# Patient Record
Sex: Male | Born: 1995
Health system: Southern US, Community
[De-identification: ages and names within clinical notes are randomized; demographics above are authoritative.]

---
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
## 2018-01-22 DIAGNOSIS — Z0000 Encounter for general adult medical examination without abnormal findings: Secondary | ICD-10-CM | POA: Diagnosis not present

## 2018-07-27 DIAGNOSIS — Z79899 Other long term (current) drug therapy: Secondary | ICD-10-CM | POA: Diagnosis not present

## 2018-07-27 DIAGNOSIS — Z1339 Encounter for screening examination for other mental health and behavioral disorders: Secondary | ICD-10-CM | POA: Diagnosis not present

## 2018-07-27 DIAGNOSIS — F900 Attention-deficit hyperactivity disorder, predominantly inattentive type: Secondary | ICD-10-CM | POA: Diagnosis not present

## 2018-07-27 DIAGNOSIS — F341 Dysthymic disorder: Secondary | ICD-10-CM | POA: Diagnosis not present

## 2018-07-27 DIAGNOSIS — F3110 Bipolar disorder, current episode manic without psychotic features, unspecified: Secondary | ICD-10-CM | POA: Diagnosis not present

## 2018-08-29 DIAGNOSIS — Z711 Person with feared health complaint in whom no diagnosis is made: Secondary | ICD-10-CM | POA: Diagnosis not present

## 2018-09-08 DIAGNOSIS — J309 Allergic rhinitis, unspecified: Secondary | ICD-10-CM | POA: Diagnosis not present

## 2018-09-08 MED FILL — LEVOCETIRIZINE 5 MG TABLET: 5 | 30 days supply | Qty: 30 | Fill #0

## 2018-09-08 MED FILL — OLOPATADINE HCL 0.2% EYE DR: 0.2 | 30 days supply | Qty: 3 | Fill #0

## 2018-09-08 MED FILL — ALBUTEROL SULFATE HFA 108 (: 108 (90 BAS | 16 days supply | Qty: 18 | Fill #0

## 2018-09-08 MED FILL — FLUTICASONE PROP 50 MCG SPR: 50 | 60 days supply | Qty: 16 | Fill #0

## 2018-09-21 DIAGNOSIS — F341 Dysthymic disorder: Secondary | ICD-10-CM | POA: Diagnosis not present

## 2018-09-21 DIAGNOSIS — Z79899 Other long term (current) drug therapy: Secondary | ICD-10-CM | POA: Diagnosis not present

## 2018-09-21 DIAGNOSIS — Z131 Encounter for screening for diabetes mellitus: Secondary | ICD-10-CM | POA: Diagnosis not present

## 2018-09-21 DIAGNOSIS — F900 Attention-deficit hyperactivity disorder, predominantly inattentive type: Secondary | ICD-10-CM | POA: Diagnosis not present

## 2018-09-21 DIAGNOSIS — F3110 Bipolar disorder, current episode manic without psychotic features, unspecified: Secondary | ICD-10-CM | POA: Diagnosis not present

## 2018-09-21 DIAGNOSIS — E7800 Pure hypercholesterolemia, unspecified: Secondary | ICD-10-CM | POA: Diagnosis not present

## 2018-09-21 DIAGNOSIS — R5383 Other fatigue: Secondary | ICD-10-CM | POA: Diagnosis not present

## 2018-09-21 DIAGNOSIS — Z1339 Encounter for screening examination for other mental health and behavioral disorders: Secondary | ICD-10-CM | POA: Diagnosis not present

## 2018-09-22 MED FILL — OLOPATADINE HCL 0.2% EYE DR: 0.2 | 30 days supply | Qty: 3 | Fill #1

## 2018-09-23 MED FILL — FLUOXETINE HCL 10 MG TABS: 10 | 30 days supply | Qty: 30 | Fill #0

## 2018-09-23 MED FILL — OLANZapine 2.5 MG TABS: 2.5 | 30 days supply | Qty: 30 | Fill #0

## 2018-10-22 DIAGNOSIS — F3110 Bipolar disorder, current episode manic without psychotic features, unspecified: Secondary | ICD-10-CM | POA: Diagnosis not present

## 2018-10-22 DIAGNOSIS — Z79899 Other long term (current) drug therapy: Secondary | ICD-10-CM | POA: Diagnosis not present

## 2018-10-22 DIAGNOSIS — F341 Dysthymic disorder: Secondary | ICD-10-CM | POA: Diagnosis not present

## 2018-10-22 DIAGNOSIS — F900 Attention-deficit hyperactivity disorder, predominantly inattentive type: Secondary | ICD-10-CM | POA: Diagnosis not present

## 2019-01-06 MED FILL — FLUOXETINE HCL 10 MG TABS: 10 | 30 days supply | Qty: 30 | Fill #0

## 2019-01-06 MED FILL — OLANZapine 2.5 MG TABS: 2.5 | 30 days supply | Qty: 30 | Fill #0

## 2019-09-01 DIAGNOSIS — J3081 Allergic rhinitis due to animal (cat) (dog) hair and dander: Secondary | ICD-10-CM | POA: Diagnosis not present

## 2019-09-01 DIAGNOSIS — J301 Allergic rhinitis due to pollen: Secondary | ICD-10-CM | POA: Diagnosis not present

## 2019-09-01 DIAGNOSIS — J3089 Other allergic rhinitis: Secondary | ICD-10-CM | POA: Diagnosis not present

## 2019-09-01 DIAGNOSIS — H1045 Other chronic allergic conjunctivitis: Secondary | ICD-10-CM | POA: Diagnosis not present

## 2019-09-14 MED FILL — LEVOCETIRIZINE 5 MG TABLET: 5 | 30 days supply | Qty: 30 | Fill #0

## 2019-11-18 DIAGNOSIS — J301 Allergic rhinitis due to pollen: Secondary | ICD-10-CM | POA: Diagnosis not present

## 2019-11-18 DIAGNOSIS — J3081 Allergic rhinitis due to animal (cat) (dog) hair and dander: Secondary | ICD-10-CM | POA: Diagnosis not present

## 2020-04-17 DIAGNOSIS — D485 Neoplasm of uncertain behavior of skin: Secondary | ICD-10-CM | POA: Diagnosis not present

## 2020-04-28 DIAGNOSIS — F988 Other specified behavioral and emotional disorders with onset usually occurring in childhood and adolescence: Secondary | ICD-10-CM | POA: Diagnosis not present

## 2020-04-28 DIAGNOSIS — Z113 Encounter for screening for infections with a predominantly sexual mode of transmission: Secondary | ICD-10-CM | POA: Diagnosis not present

## 2020-04-28 DIAGNOSIS — L729 Follicular cyst of the skin and subcutaneous tissue, unspecified: Secondary | ICD-10-CM | POA: Diagnosis not present

## 2020-04-28 DIAGNOSIS — F3160 Bipolar disorder, current episode mixed, unspecified: Secondary | ICD-10-CM | POA: Diagnosis not present

## 2020-07-20 DIAGNOSIS — M25562 Pain in left knee: Secondary | ICD-10-CM | POA: Diagnosis not present

## 2020-07-20 DIAGNOSIS — S83422A Sprain of lateral collateral ligament of left knee, initial encounter: Secondary | ICD-10-CM | POA: Diagnosis not present

## 2020-07-27 DIAGNOSIS — S83512A Sprain of anterior cruciate ligament of left knee, initial encounter: Secondary | ICD-10-CM | POA: Diagnosis not present

## 2020-08-18 DIAGNOSIS — S83512A Sprain of anterior cruciate ligament of left knee, initial encounter: Secondary | ICD-10-CM | POA: Diagnosis not present

## 2020-09-11 ENCOUNTER — Other Ambulatory Visit (HOSPITAL_COMMUNITY): Payer: Self-pay

## 2020-09-11 DIAGNOSIS — S83522A Sprain of posterior cruciate ligament of left knee, initial encounter: Secondary | ICD-10-CM | POA: Diagnosis not present

## 2020-09-11 DIAGNOSIS — G8918 Other acute postprocedural pain: Secondary | ICD-10-CM | POA: Diagnosis not present

## 2020-09-11 DIAGNOSIS — M65862 Other synovitis and tenosynovitis, left lower leg: Secondary | ICD-10-CM | POA: Diagnosis not present

## 2020-09-11 DIAGNOSIS — M25862 Other specified joint disorders, left knee: Secondary | ICD-10-CM | POA: Diagnosis not present

## 2020-09-11 DIAGNOSIS — S83272A Complex tear of lateral meniscus, current injury, left knee, initial encounter: Secondary | ICD-10-CM | POA: Diagnosis not present

## 2020-09-11 DIAGNOSIS — S83512A Sprain of anterior cruciate ligament of left knee, initial encounter: Secondary | ICD-10-CM | POA: Diagnosis not present

## 2020-09-11 DIAGNOSIS — M659 Synovitis and tenosynovitis, unspecified: Secondary | ICD-10-CM | POA: Diagnosis not present

## 2020-09-11 DIAGNOSIS — M24662 Ankylosis, left knee: Secondary | ICD-10-CM | POA: Diagnosis not present

## 2020-09-11 MED ORDER — ONDANSETRON 4 MG PO TBDP
ORAL_TABLET | ORAL | 0 refills | Status: DC
  Filled 2020-09-11: qty 20, 7d supply, fill #0

## 2020-09-11 MED ORDER — METHOCARBAMOL 500 MG PO TABS
ORAL_TABLET | ORAL | 0 refills | Status: DC
  Filled 2020-09-11: qty 45, 11d supply, fill #0

## 2020-09-15 ENCOUNTER — Other Ambulatory Visit (HOSPITAL_COMMUNITY): Payer: Self-pay

## 2020-09-22 ENCOUNTER — Other Ambulatory Visit (HOSPITAL_COMMUNITY): Payer: Self-pay

## 2020-12-28 DIAGNOSIS — Z03818 Encounter for observation for suspected exposure to other biological agents ruled out: Secondary | ICD-10-CM | POA: Diagnosis not present

## 2021-08-17 ENCOUNTER — Other Ambulatory Visit: Payer: Self-pay

## 2021-08-17 ENCOUNTER — Ambulatory Visit
Admission: EM | Admit: 2021-08-17 | Discharge: 2021-08-17 | Disposition: A | Discharge status: Home / Self Care | Payer: 59 | Attending: Emergency Medicine | Admitting: Emergency Medicine

## 2021-08-17 DIAGNOSIS — N5089 Other specified disorders of the male genital organs: Secondary | ICD-10-CM | POA: Insufficient documentation

## 2021-08-17 DIAGNOSIS — R1031 Right lower quadrant pain: Secondary | ICD-10-CM | POA: Insufficient documentation

## 2021-08-17 NOTE — Discharge Instructions (Addendum)
The results of your STD testing today will be made available to you once they are complete, this typically takes 3 to 5 days.  They will initially be posted to your MyChart and, if any of your results are abnormal, you will receive a phone call with those results along with further instructions regarding any further treatment, if needed.  ? ?If your syphilis test is positive, you will be asked to come back to the clinic for a Bicillin injection for definitive treatment. ? ?If your herpes test is positive, you will be provided with a prescription for Val acyclovir, 1 g daily for the next 90 days.  I have initiated a request to help you find a primary care provider so that this provider will be able to continue to prescribe valacyclovir for you.  Genital herpes responds well to suppressive treatment with valacyclovir if it is taken regularly. ?  ?Please remember that the only way to prevent transmission of sexually transmitted disease when having sexual intercourse is to use condoms.  Repeat sexually transmitted infections can cause scarring of the tubes that carry sperm from your testicles to your penis during ejaculation.  This can interfere with your your ability to have children.  Repeat exposures to sexually transmitted diseases can also increase your risk of human papilloma virus which causes genital warts. ?  ?If you have not had complete resolution of your symptoms after completing treatment, please return for repeat evaluation. ?  ?Thank you for visiting urgent care today.  I appreciate the opportunity to participate in your care. ? ?

## 2021-08-17 NOTE — ED Provider Notes (Addendum)
[facility]    CSN: [id number] Arrival date & time: 08/17/21  1134    HISTORY   Chief Complaint  Patient presents with   SEXUALLY TRANSMITTED DISEASE   HPI Richard Hill is a 26 y.o. male. Pt reports thinking he had an abrasion/laceration to shaft of penis on 02/14.  Pt put peroxide on area. Was initially improving and almost healed then worsened after sexually active with partner.  Wants to make sure it is not an STD.  Is sexually active in monogamous relationship.  No penile drainage, rash, dysuria.  Just this worsening sore.  Patient also complains of a lump in his right groin.  Denies history of hernia.   History reviewed. No pertinent past medical history. There are no problems to display for this patient.  History reviewed. No pertinent surgical history.  Home Medications    Prior to Admission medications   Medication Sig Start Date End Date Taking? Authorizing Provider   Family History Family History  Problem Relation Age of Onset   Healthy Mother    Healthy Father    Social History Social History   Tobacco Use   Smoking status: Never   Smokeless tobacco: Never  Vaping Use   Vaping Use: Never used  Substance Use Topics   Alcohol use: Yes    Comment: socially   Drug use: Never   Allergies   Patient has no known allergies.  Review of Systems Review of Systems Pertinent findings noted in history of present illness.   Physical Exam Triage Vital Signs ED Triage Vitals  Enc Vitals Group     BP 04/06/21 0827 (!) 147/82     Pulse Rate 04/06/21 0827 72     Resp 04/06/21 0827 18     Temp 04/06/21 0827 98.3 F (36.8 C)     Temp Source 04/06/21 0827 Oral     SpO2 04/06/21 0827 98 %     Weight --      Height --      Head Circumference --      Peak Flow --      Pain Score 04/06/21 0826 5     Pain Loc --      Pain Edu? --      Excl. in [place]? --   No data found.  Updated Vital Signs BP (!) 149/106 (BP Location: Right Arm)     Pulse 99    Temp 98.8 F (37.1 C) (Oral)    Resp 20    SpO2 98%   Physical Exam Vitals and nursing note reviewed.  Constitutional:      General: He is not in acute distress.    Appearance: Normal appearance. He is not ill-appearing.  HENT:     Head: Normocephalic and atraumatic.  Eyes:     General: Lids are normal.        Right eye: No discharge.        Left eye: No discharge.     Extraocular Movements: Extraocular movements intact.     Conjunctiva/sclera: Conjunctivae normal.     Right eye: Right conjunctiva is not injected.     Left eye: Left conjunctiva is not injected.  Neck:     Trachea: Trachea and phonation normal.  Cardiovascular:     Rate and Rhythm: Normal rate and regular rhythm.     Pulses: Normal pulses.     Heart sounds: Normal heart sounds. No murmur heard.   No friction rub. No gallop.  Pulmonary:  Effort: Pulmonary effort is normal. No accessory muscle usage, prolonged expiration or respiratory distress.     Breath sounds: Normal breath sounds. No stridor, decreased air movement or transmitted upper airway sounds. No decreased breath sounds, wheezing, rhonchi or rales.  Chest:     Chest wall: No tenderness.  Genitourinary:    Comments: 1 cm lesion at distal end of penile shaft beneath glans penis that is deep, ragged with a purulent yellow-gray base and undetermined, violaceous border Musculoskeletal:        General: Normal range of motion.     Cervical back: Normal range of motion and neck supple. Normal range of motion.  Lymphadenopathy:     Cervical: No cervical adenopathy.     Lower Body: Right inguinal adenopathy (Tender to palpation) present.  Skin:    General: Skin is warm and dry.     Findings: No erythema or rash.  Neurological:     General: No focal deficit present.     Mental Status: He is alert and oriented to person, place, and time.  Psychiatric:        Mood and Affect: Mood normal.        Behavior: Behavior normal.    Visual  Acuity Right Eye Distance:   Left Eye Distance:   Bilateral Distance:    Right Eye Near:   Left Eye Near:    Bilateral Near:     [facility] Couse / Diagnostics / Procedures:    EKG  Radiology No results found.  Procedures Procedures (including critical care time)  UC Diagnoses / Final Clinical Impressions(s)   I have reviewed the triage vital signs and the nursing notes.  Pertinent labs & imaging results that were available during my care of the patient were reviewed by me and considered in my medical decision making (see chart for details).    Final diagnoses:  Pain in the groin, right  Genital lesion, male   STD screening was performed, patient advised that the results be posted to their MyChart and if any of the results are positive, they will be notified by phone, further treatment will be provided as indicated based on results of STD screening.  Patient advised that if his syphilis test is positive, he will be asked to come back to the clinic for a Bicillin injection.  Patient advised that if his herpes culture is positive, he will be provided with a prescription for valacyclovir 1 g to take daily for the next 90 days until he can follow-up with a primary care provider who can continue this prescription for him.  Patient was advised to abstain from sexual intercourse for the next while awaiting test results and 7 more days if treatment is required.    Patient was also advised to use condoms to protect themselves from STD exposure.  Return precautions advised.  Drug allergies reviewed, all questions addressed.     ED Prescriptions   None    PDMP not reviewed this encounter.  Pending results:  Labs Reviewed  HSV CULTURE AND TYPING  RPR  HIV ANTIBODY (ROUTINE TESTING W REFLEX)  CYTOLOGY, (ORAL, ANAL, URETHRAL) ANCILLARY ONLY    Medications Ordered in UC: Medications - No data to display  Disposition Upon Discharge:  Condition: stable for discharge home  Patient  presented with concern for an acute illness with associated systemic symptoms and significant discomfort requiring urgent management. In my opinion, this is a condition that a prudent lay person (someone who possesses an average knowledge of  health and medicine) may potentially expect to result in complications if not addressed urgently such as respiratory distress, impairment of bodily function or dysfunction of bodily organs.   As such, the patient has been evaluated and assessed, work-up was performed and treatment was provided in alignment with urgent care protocols and evidence based medicine.  Patient/parent/caregiver has been advised that the patient may require follow up for further testing and/or treatment if the symptoms continue in spite of treatment, as clinically indicated and appropriate.  Routine symptom specific, illness specific and/or disease specific instructions were discussed with the patient and/or caregiver at length.  Prevention strategies for avoiding STD exposure were also discussed.  The patient will follow up with their current PCP if and as advised. If the patient does not currently have a PCP we will assist them in obtaining one.   The patient may need specialty follow up if the symptoms continue, in spite of conservative treatment and management, for further workup, evaluation, consultation and treatment as clinically indicated and appropriate.  Patient/parent/caregiver verbalized understanding and agreement of plan as discussed.  All questions were addressed during visit.  Please see discharge instructions below for further details of plan.  Discharge Instructions:   Discharge Instructions      The results of your STD testing today will be made available to you once they are complete, this typically takes 3 to 5 days.  They will initially be posted to your MyChart and, if any of your results are abnormal, you will receive a phone call with those results along with  further instructions regarding any further treatment, if needed.   If your syphilis test is positive, you will be asked to come back to the clinic for a Bicillin injection for definitive treatment.  If your herpes test is positive, you will be provided with a prescription for Val acyclovir, 1 g daily for the next 90 days.  I have initiated a request to help you find a primary care provider so that this provider will be able to continue to prescribe valacyclovir for you.  Genital herpes responds well to suppressive treatment with valacyclovir if it is taken regularly.   Please remember that the only way to prevent transmission of sexually transmitted disease when having sexual intercourse is to use condoms.  Repeat sexually transmitted infections can cause scarring of the tubes that carry sperm from your testicles to your penis during ejaculation.  This can interfere with your your ability to have children.  Repeat exposures to sexually transmitted diseases can also increase your risk of human papilloma virus which causes genital warts.   If you have not had complete resolution of your symptoms after completing treatment, please return for repeat evaluation.   Thank you for visiting urgent care today.  I appreciate the opportunity to participate in your care.       This office note has been dictated using Museum/gallery curator.  Unfortunately, and despite my best efforts, this method of dictation can sometimes lead to occasional typographical or grammatical errors.  I apologize in advance if this occurs.      Lynden Oxford Scales, PA-C 08/17/21 1253    Lynden Oxford Scales, PA-C 08/17/21 1257

## 2021-08-17 NOTE — ED Triage Notes (Signed)
Pt reports thinking he had an abrasion/laceration to shaft of penis on 02/14.  Pt put peroxide on area. Was initially improving and almost healed then worsened after sexually active with partner.  Wants to make sure it is not an STD.  Is sexually active in monogamous relationship.  No penile drainage, rash, dysuria.  Just this worsening sore.   ? ?Pt also reports lump in R groin - possibly hernia.  ?

## 2021-08-19 LAB — HSV CULTURE AND TYPING

## 2021-08-20 ENCOUNTER — Other Ambulatory Visit (HOSPITAL_COMMUNITY): Payer: Self-pay

## 2021-08-20 ENCOUNTER — Telehealth: Payer: Self-pay | Admitting: Emergency Medicine

## 2021-08-20 DIAGNOSIS — B009 Herpesviral infection, unspecified: Secondary | ICD-10-CM | POA: Insufficient documentation

## 2021-08-20 LAB — CYTOLOGY, (ORAL, ANAL, URETHRAL) ANCILLARY ONLY
Chlamydia: NEGATIVE
Comment: NEGATIVE
Comment: NEGATIVE
Comment: NORMAL
Neisseria Gonorrhea: NEGATIVE
Trichomonas: NEGATIVE

## 2021-08-20 MED ORDER — VALACYCLOVIR HCL 1 G PO TABS
1000.0000 mg | ORAL_TABLET | Freq: Every day | ORAL | 3 refills | Status: AC
  Filled 2021-08-20: qty 90, 90d supply, fill #0
  Filled 2021-12-23 – 2022-01-28 (×2): qty 90, 90d supply, fill #1

## 2021-08-20 NOTE — Telephone Encounter (Signed)
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  Patient provided with a daily suppressive dose of valacyclovir 1 g.  Given duration of symptoms prior to viral testing, patient would not benefit from acute therapy.  Patient provided with 360 days of medication.  Patient advised to follow-up with primary care for further refills. ?

## 2021-08-21 LAB — HIV ANTIBODY (ROUTINE TESTING W REFLEX): HIV Screen 4th Generation wRfx: NONREACTIVE

## 2021-08-21 LAB — RPR: RPR Ser Ql: NONREACTIVE

## 2021-08-21 LAB — SPECIMEN STATUS REPORT

## 2021-09-03 ENCOUNTER — Other Ambulatory Visit (HOSPITAL_COMMUNITY): Payer: Self-pay

## 2021-09-03 DIAGNOSIS — R590 Localized enlarged lymph nodes: Secondary | ICD-10-CM | POA: Diagnosis not present

## 2021-09-03 DIAGNOSIS — B009 Herpesviral infection, unspecified: Secondary | ICD-10-CM | POA: Diagnosis not present

## 2021-09-03 DIAGNOSIS — L239 Allergic contact dermatitis, unspecified cause: Secondary | ICD-10-CM | POA: Diagnosis not present

## 2021-09-03 DIAGNOSIS — J309 Allergic rhinitis, unspecified: Secondary | ICD-10-CM | POA: Diagnosis not present

## 2021-09-03 MED ORDER — PREDNISONE 10 MG PO TABS
ORAL_TABLET | ORAL | 0 refills | Status: AC
  Filled 2021-09-03: qty 21, 6d supply, fill #0

## 2021-09-11 ENCOUNTER — Other Ambulatory Visit (HOSPITAL_COMMUNITY): Payer: Self-pay

## 2021-09-25 DIAGNOSIS — R590 Localized enlarged lymph nodes: Secondary | ICD-10-CM | POA: Diagnosis not present

## 2021-10-08 ENCOUNTER — Other Ambulatory Visit: Payer: Self-pay | Admitting: Family Medicine

## 2021-10-08 ENCOUNTER — Ambulatory Visit
Admission: RE | Admit: 2021-10-08 | Discharge: 2021-10-08 | Disposition: A | Discharge status: Home / Self Care | Payer: 59 | Source: Ambulatory Visit | Attending: Family Medicine | Admitting: Family Medicine

## 2021-10-08 DIAGNOSIS — R1900 Intra-abdominal and pelvic swelling, mass and lump, unspecified site: Secondary | ICD-10-CM | POA: Diagnosis not present

## 2021-10-10 ENCOUNTER — Other Ambulatory Visit (HOSPITAL_BASED_OUTPATIENT_CLINIC_OR_DEPARTMENT_OTHER): Payer: Self-pay | Admitting: Family Medicine

## 2021-10-10 ENCOUNTER — Encounter (HOSPITAL_BASED_OUTPATIENT_CLINIC_OR_DEPARTMENT_OTHER): Payer: Self-pay | Admitting: Radiology

## 2021-10-10 DIAGNOSIS — R1900 Intra-abdominal and pelvic swelling, mass and lump, unspecified site: Secondary | ICD-10-CM

## 2021-10-11 ENCOUNTER — Encounter (HOSPITAL_BASED_OUTPATIENT_CLINIC_OR_DEPARTMENT_OTHER): Payer: Self-pay

## 2021-10-11 ENCOUNTER — Ambulatory Visit (HOSPITAL_BASED_OUTPATIENT_CLINIC_OR_DEPARTMENT_OTHER)
Admission: RE | Admit: 2021-10-11 | Discharge: 2021-10-11 | Disposition: A | Discharge status: Home / Self Care | Payer: 59 | Source: Ambulatory Visit | Attending: Family Medicine | Admitting: Family Medicine

## 2021-10-11 ENCOUNTER — Other Ambulatory Visit: Payer: Self-pay | Admitting: Family Medicine

## 2021-10-11 ENCOUNTER — Ambulatory Visit (HOSPITAL_BASED_OUTPATIENT_CLINIC_OR_DEPARTMENT_OTHER): Admission: RE | Admit: 2021-10-11 | Payer: 59 | Source: Ambulatory Visit

## 2021-10-11 ENCOUNTER — Other Ambulatory Visit (HOSPITAL_COMMUNITY): Payer: Self-pay | Admitting: Family Medicine

## 2021-10-11 DIAGNOSIS — R590 Localized enlarged lymph nodes: Secondary | ICD-10-CM

## 2021-10-11 DIAGNOSIS — R1900 Intra-abdominal and pelvic swelling, mass and lump, unspecified site: Secondary | ICD-10-CM | POA: Insufficient documentation

## 2021-10-11 MED ORDER — IOHEXOL 300 MG/ML  SOLN
100.0000 mL | Freq: Once | INTRAMUSCULAR | Status: AC | PRN
  Administered 2021-10-11: 80 mL via INTRAVENOUS

## 2021-10-12 ENCOUNTER — Encounter: Payer: Self-pay | Admitting: *Deleted

## 2021-10-12 NOTE — Progress Notes (Unsigned)
Aletta Edouard, MD  [facility] Maser ?OK for US guided core biopsy of left inguinal lymph node.  ? ?GY ?

## 2021-10-15 ENCOUNTER — Other Ambulatory Visit: Payer: Self-pay | Admitting: Student

## 2021-10-16 ENCOUNTER — Encounter (HOSPITAL_COMMUNITY): Payer: Self-pay

## 2021-10-16 ENCOUNTER — Ambulatory Visit (HOSPITAL_COMMUNITY)
Admission: RE | Admit: 2021-10-16 | Discharge: 2021-10-16 | Disposition: A | Discharge status: Home / Self Care | Payer: 59 | Source: Ambulatory Visit | Attending: Family Medicine | Admitting: Family Medicine

## 2021-10-16 ENCOUNTER — Other Ambulatory Visit: Payer: Self-pay

## 2021-10-16 DIAGNOSIS — R590 Localized enlarged lymph nodes: Secondary | ICD-10-CM | POA: Insufficient documentation

## 2021-10-16 DIAGNOSIS — R1900 Intra-abdominal and pelvic swelling, mass and lump, unspecified site: Secondary | ICD-10-CM | POA: Diagnosis not present

## 2021-10-16 DIAGNOSIS — R599 Enlarged lymph nodes, unspecified: Secondary | ICD-10-CM | POA: Diagnosis not present

## 2021-10-16 MED ORDER — FENTANYL CITRATE (PF) 100 MCG/2ML IJ SOLN
INTRAMUSCULAR | Status: AC | PRN
  Administered 2021-10-16: 50 ug via INTRAVENOUS

## 2021-10-16 MED ORDER — LIDOCAINE HCL (PF) 1 % IJ SOLN
INTRAMUSCULAR | Status: AC
  Filled 2021-10-16: qty 30

## 2021-10-16 MED ORDER — FENTANYL CITRATE (PF) 100 MCG/2ML IJ SOLN
INTRAMUSCULAR | Status: AC
  Filled 2021-10-16: qty 2

## 2021-10-16 MED ORDER — MIDAZOLAM HCL 2 MG/2ML IJ SOLN
INTRAMUSCULAR | Status: AC
  Filled 2021-10-16: qty 2

## 2021-10-16 MED ORDER — MIDAZOLAM HCL 2 MG/2ML IJ SOLN
INTRAMUSCULAR | Status: AC | PRN
  Administered 2021-10-16: 1 mg via INTRAVENOUS

## 2021-10-16 MED ORDER — SODIUM CHLORIDE 0.9 % IV SOLN: INTRAVENOUS | Status: DC

## 2021-10-16 NOTE — Procedures (Signed)
Interventional Radiology Procedure Note ? ?Procedure: US guided biopsy of left inguinal lymph node ?Complications: None ?EBL: None ?Specimen: 4 x 16g core ? ?Recommendations: ?- Bedrest 1 hours.   ?- Routine wound care ?- Follow up pathology ?- Advance diet  ?- ice as needed ? ?Signed, ? ?Corrie Mckusick, DO ? ? ?

## 2021-10-16 NOTE — H&P (Signed)
? ? ? ?Chief Complaint: ?Lymphadenopathy ? ?Referring Physician(s): ?Sela Hilding ? ?Supervising Physician: Corrie Mckusick ? ?Patient Status: [facility] - Out-pt ? ?History of Present Illness: ?Richard Hill is a 26 y.o. healthy male who was seen in the ED recently for a  genital lesion. ? ?At that time he also c/o a lump in his groin. ? ?US done 5/2 showed= ?Solid masses in the palpable area left groin, largest ?measures 4.8 x 2.1 x 2 5 cm. This is nonspecific, question abnormal ?enlarged lymph nodes. There are enlarged lymph nodes in the ?contralateral right groin, largest measures 1.2 cm. Lymphoma is not ?excluded. Recommend further evaluation with CT of the abdomen and ?pelvis. ? ?CT done 5/4 showed= ?Enlarged left external iliac and left inguinal lymph nodes, greater ?than expected in size for reactive adenopathy and concerning for ?lymphoproliferative disease. Recommend tissue sampling. ? ?He is here today for biopsy. ? ?He is NPO. No nausea/vomiting. No Fever/chills. ROS negative. ? ? ?History reviewed. No pertinent past medical history. ? ?History reviewed. No pertinent surgical history. ? ?Allergies: ?Patient has no known allergies. ? ?Medications: ?Prior to Admission medications   ?Medication Sig Start Date End Date Taking? Authorizing Provider  ?predniSONE (DELTASONE) 10 MG tablet Taper dose. Start with 6 tabs on day 1 then decrease by 1 tab daily (6-5-4-3-2-1). 09/03/21  Yes   ?valACYclovir (VALTREX) 1000 MG tablet Take 1 tablet (1,000 mg total) by mouth daily. 08/20/21 08/15/22 Yes Lynden Oxford Scales, PA-C  ?  ? ?Family History  ?Problem Relation Age of Onset  ? Healthy Mother   ? Healthy Father   ? ? ?Social History  ? ?Socioeconomic History  ? Marital status: Single  ?  Spouse name: Not on file  ? Number of children: Not on file  ? Years of education: Not on file  ? Highest education level: Not on file  ?Occupational History  ? Not on file  ?Tobacco Use  ? Smoking status: Never  ? Smokeless  tobacco: Never  ?Vaping Use  ? Vaping Use: Never used  ?Substance and Sexual Activity  ? Alcohol use: Yes  ?  Comment: socially  ? Drug use: Never  ? Sexual activity: Yes  ?  Birth control/protection: None  ?Other Topics Concern  ? Not on file  ?Social History Narrative  ? Not on file  ? ?Social Determinants of Health  ? ?Financial Resource Strain: Not on file  ?Food Insecurity: Not on file  ?Transportation Needs: Not on file  ?Physical Activity: Not on file  ?Stress: Not on file  ?Social Connections: Not on file  ? ? ? ?Review of Systems: A 12 point ROS discussed and pertinent positives are indicated in the HPI above.  All other systems are negative. ? ?Review of Systems ? ?Vital Signs: ?BP (!) 154/87 (BP Location: Right Arm)   Pulse (!) 59   Temp 98 ?F (36.7 ?C) (Oral)   Ht '5\' 7"'$  (1.702 m)   Wt 180 lb (81.6 kg)   BMI 28.19 kg/m?  ? ?Physical Exam ?Vitals reviewed.  ?Constitutional:   ?   Appearance: Normal appearance.  ?HENT:  ?   Head: Normocephalic and atraumatic.  ?Eyes:  ?   Extraocular Movements: Extraocular movements intact.  ?Cardiovascular:  ?   Rate and Rhythm: Normal rate and regular rhythm.  ?Pulmonary:  ?   Effort: Pulmonary effort is normal. No respiratory distress.  ?   Breath sounds: Normal breath sounds.  ?Abdominal:  ?   General: There is  no distension.  ?   Palpations: Abdomen is soft.  ?   Tenderness: There is no abdominal tenderness.  ?Musculoskeletal:     ?   General: Normal range of motion.  ?   Cervical back: Normal range of motion.  ?Skin: ?   General: Skin is warm and dry.  ?Neurological:  ?   General: No focal deficit present.  ?   Mental Status: He is alert and oriented to person, place, and time.  ?Psychiatric:     ?   Mood and Affect: Mood normal.     ?   Behavior: Behavior normal.     ?   Thought Content: Thought content normal.     ?   Judgment: Judgment normal.  ? ? ?Imaging: ?US PELVIS LIMITED (TRANSABDOMINAL ONLY) ? ?Result Date: 10/09/2021 ?CLINICAL DATA:  Palpable mass in the  left groin. EXAM: LIMITED ULTRASOUND OF PELVIS TECHNIQUE: Limited transabdominal ultrasound examination of the pelvis was performed. COMPARISON:  None Available. FINDINGS: There are solid masses in the palpable area left groin, largest measures 4.8 x 2.1 x 2 5 cm. This is nonspecific, question abnormal enlarged lymph nodes. There are enlarged lymph nodes in the contralateral right groin, largest measures 1.2 cm. IMPRESSION: There are solid masses in the palpable area left groin, largest measures 4.8 x 2.1 x 2 5 cm. This is nonspecific, question abnormal enlarged lymph nodes. There are enlarged lymph nodes in the contralateral right groin, largest measures 1.2 cm. Lymphoma is not excluded. Recommend further evaluation with CT of the abdomen and pelvis. Electronically Signed   By: Abelardo Diesel M.D.   On: 10/09/2021 11:21  ? ?CT ABDOMEN PELVIS W CONTRAST ? ?Result Date: 10/11/2021 ?CLINICAL DATA:  Palpable mass of the left groin EXAM: CT ABDOMEN AND PELVIS WITH CONTRAST TECHNIQUE: Multidetector CT imaging of the abdomen and pelvis was performed using the standard protocol following bolus administration of intravenous contrast. RADIATION DOSE REDUCTION: This exam was performed according to the departmental dose-optimization program which includes automated exposure control, adjustment of the mA and/or kV according to patient size and/or use of iterative reconstruction technique. CONTRAST:  52m OMNIPAQUE IOHEXOL 300 MG/ML  SOLN COMPARISON:  None Available. FINDINGS: Lower chest: No acute abnormality. Hepatobiliary: No focal liver abnormality is seen. No gallstones, gallbladder wall thickening, or biliary dilatation. Pancreas: Unremarkable. No pancreatic ductal dilatation or surrounding inflammatory changes. Spleen: Normal in size without focal abnormality. Adrenals/Urinary Tract: Adrenal glands are unremarkable. Kidneys are normal, without renal calculi, focal lesion, or hydronephrosis. Bladder is unremarkable.  Stomach/Bowel: Stomach is within normal limits. Appendix appears normal. No evidence of bowel wall thickening, distention, or inflammatory changes. Vascular/Lymphatic: No significant vascular findings. Enlarged left external iliac lymph node measuring 4 cm in short axis on series 2, image 73. Left inguinal adenopathy. Largest left inguinal lymph node measures 1.9 cm in short axis on series 2, image 78 Reproductive: Prostate is unremarkable. Other: No abdominal wall hernia or abnormality. No abdominopelvic ascites. Musculoskeletal: No acute or significant osseous findings. IMPRESSION: Enlarged left external iliac and left inguinal lymph nodes, greater than expected in size for reactive adenopathy and concerning for lymphoproliferative disease. Recommend tissue sampling. Electronically Signed   By: LYetta GlassmanM.D.   On: 10/11/2021 15:04   ? ?Labs: ? ?CBC: ?No results for input(s): WBC, HGB, HCT, PLT in the last 8760 hours. ? ?COAGS: ?No results for input(s): INR, APTT in the last 8760 hours. ? ?BMP: ?No results for input(s): NA, K, CL, CO2, GLUCOSE, BUN,  CALCIUM, CREATININE, GFRNONAA, GFRAA in the last 8760 hours. ? ?Invalid input(s): CMP ? ?LIVER FUNCTION TESTS: ?No results for input(s): BILITOT, AST, ALT, ALKPHOS, PROT, ALBUMIN in the last 8760 hours. ? ?TUMOR MARKERS: ?No results for input(s): AFPTM, CEA, CA199, CHROMGRNA in the last 8760 hours. ? ?Assessment and Plan: ? ?Enlarged left external iliac and left inguinal lymph nodes, greater than expected in size for reactive adenopathy and concerning for lymphoproliferative disease.  ? ?Will proceed with image guided biopsy of the left node today by Dr. Earleen Newport. ? ?Risks and benefits of biopsy of left inguinal lymph node was discussed with the patient and/or patient's family including, but not limited to bleeding, infection, damage to adjacent structures or low yield requiring additional tests. ? ?All of the questions were answered and there is agreement to  proceed. ? ?Consent signed and in chart. ? ?Thank you for allowing our service to participate in Pinson 's care. ? ?Electronically Signed: ?Murrell Redden, PA-C   ?10/16/2021, 1:04 PM ? ? ? ? ? ?I spe

## 2021-10-17 LAB — SURGICAL PATHOLOGY

## 2021-12-24 ENCOUNTER — Other Ambulatory Visit (HOSPITAL_COMMUNITY): Payer: Self-pay

## 2022-01-01 ENCOUNTER — Other Ambulatory Visit (HOSPITAL_COMMUNITY): Payer: Self-pay

## 2022-01-28 ENCOUNTER — Other Ambulatory Visit (HOSPITAL_COMMUNITY): Payer: Self-pay

## 2022-04-10 DIAGNOSIS — Z0000 Encounter for general adult medical examination without abnormal findings: Secondary | ICD-10-CM | POA: Diagnosis not present

## 2022-04-10 DIAGNOSIS — R590 Localized enlarged lymph nodes: Secondary | ICD-10-CM | POA: Diagnosis not present

## 2022-04-10 DIAGNOSIS — Z1159 Encounter for screening for other viral diseases: Secondary | ICD-10-CM | POA: Diagnosis not present

## 2022-04-10 DIAGNOSIS — M25531 Pain in right wrist: Secondary | ICD-10-CM | POA: Diagnosis not present

## 2022-04-10 DIAGNOSIS — Z23 Encounter for immunization: Secondary | ICD-10-CM | POA: Diagnosis not present

## 2022-04-15 ENCOUNTER — Other Ambulatory Visit (HOSPITAL_BASED_OUTPATIENT_CLINIC_OR_DEPARTMENT_OTHER): Payer: Self-pay | Admitting: Family Medicine

## 2022-04-15 DIAGNOSIS — R1900 Intra-abdominal and pelvic swelling, mass and lump, unspecified site: Secondary | ICD-10-CM

## 2022-07-29 ENCOUNTER — Ambulatory Visit
Admission: RE | Admit: 2022-07-29 | Discharge: 2022-07-29 | Disposition: A | Discharge status: Home / Self Care | Payer: No Typology Code available for payment source | Source: Ambulatory Visit | Attending: Nurse Practitioner | Admitting: Nurse Practitioner

## 2022-07-29 ENCOUNTER — Other Ambulatory Visit: Payer: Self-pay | Admitting: Nurse Practitioner

## 2022-07-29 DIAGNOSIS — Z021 Encounter for pre-employment examination: Secondary | ICD-10-CM

## 2022-08-15 DIAGNOSIS — Z4789 Encounter for other orthopedic aftercare: Secondary | ICD-10-CM | POA: Diagnosis not present

## 2022-08-21 ENCOUNTER — Other Ambulatory Visit (HOSPITAL_COMMUNITY): Payer: Self-pay

## 2022-08-21 DIAGNOSIS — R052 Subacute cough: Secondary | ICD-10-CM | POA: Diagnosis not present

## 2022-08-21 DIAGNOSIS — J301 Allergic rhinitis due to pollen: Secondary | ICD-10-CM | POA: Diagnosis not present

## 2022-08-21 DIAGNOSIS — H1045 Other chronic allergic conjunctivitis: Secondary | ICD-10-CM | POA: Diagnosis not present

## 2022-08-21 DIAGNOSIS — J3081 Allergic rhinitis due to animal (cat) (dog) hair and dander: Secondary | ICD-10-CM | POA: Diagnosis not present

## 2022-08-21 DIAGNOSIS — J3089 Other allergic rhinitis: Secondary | ICD-10-CM | POA: Diagnosis not present

## 2022-08-21 MED ORDER — LEVOCETIRIZINE DIHYDROCHLORIDE 5 MG PO TABS
5.0000 mg | ORAL_TABLET | Freq: Every evening | ORAL | 5 refills | Status: AC
  Filled 2022-08-21 – 2022-08-31 (×2): qty 30, 30d supply, fill #0

## 2022-08-28 ENCOUNTER — Other Ambulatory Visit (HOSPITAL_COMMUNITY): Payer: Self-pay

## 2022-08-31 ENCOUNTER — Other Ambulatory Visit (HOSPITAL_COMMUNITY): Payer: Self-pay

## 2022-10-04 DIAGNOSIS — L309 Dermatitis, unspecified: Secondary | ICD-10-CM | POA: Diagnosis not present

## 2022-12-23 DIAGNOSIS — F3162 Bipolar disorder, current episode mixed, moderate: Secondary | ICD-10-CM | POA: Diagnosis not present

## 2023-04-07 ENCOUNTER — Emergency Department (HOSPITAL_COMMUNITY)
Admission: EM | Admit: 2023-04-07 | Discharge: 2023-04-07 | Disposition: A | Discharge status: Home / Self Care | Payer: No Typology Code available for payment source | Attending: Emergency Medicine | Admitting: Emergency Medicine

## 2023-04-07 ENCOUNTER — Emergency Department (HOSPITAL_COMMUNITY): Payer: No Typology Code available for payment source

## 2023-04-07 ENCOUNTER — Other Ambulatory Visit: Payer: Self-pay

## 2023-04-07 DIAGNOSIS — Y92410 Unspecified street and highway as the place of occurrence of the external cause: Secondary | ICD-10-CM | POA: Insufficient documentation

## 2023-04-07 DIAGNOSIS — S0990XA Unspecified injury of head, initial encounter: Secondary | ICD-10-CM | POA: Insufficient documentation
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
## 2023-06-13 DIAGNOSIS — F4310 Post-traumatic stress disorder, unspecified: Secondary | ICD-10-CM | POA: Diagnosis not present

## 2023-06-23 DIAGNOSIS — F4310 Post-traumatic stress disorder, unspecified: Secondary | ICD-10-CM | POA: Diagnosis not present

## 2023-07-07 DIAGNOSIS — F4310 Post-traumatic stress disorder, unspecified: Secondary | ICD-10-CM | POA: Diagnosis not present

## 2023-07-09 DIAGNOSIS — F4310 Post-traumatic stress disorder, unspecified: Secondary | ICD-10-CM | POA: Diagnosis not present

## 2023-07-14 DIAGNOSIS — F4310 Post-traumatic stress disorder, unspecified: Secondary | ICD-10-CM | POA: Diagnosis not present

## 2023-07-17 DIAGNOSIS — F4310 Post-traumatic stress disorder, unspecified: Secondary | ICD-10-CM | POA: Diagnosis not present

## 2023-07-23 DIAGNOSIS — F4310 Post-traumatic stress disorder, unspecified: Secondary | ICD-10-CM | POA: Diagnosis not present

## 2023-07-24 DIAGNOSIS — F4310 Post-traumatic stress disorder, unspecified: Secondary | ICD-10-CM | POA: Diagnosis not present

## 2023-07-28 DIAGNOSIS — R1110 Vomiting, unspecified: Secondary | ICD-10-CM | POA: Diagnosis not present

## 2023-07-28 DIAGNOSIS — R197 Diarrhea, unspecified: Secondary | ICD-10-CM | POA: Diagnosis not present

## 2023-08-07 DIAGNOSIS — F4310 Post-traumatic stress disorder, unspecified: Secondary | ICD-10-CM | POA: Diagnosis not present

## 2023-08-15 DIAGNOSIS — F4310 Post-traumatic stress disorder, unspecified: Secondary | ICD-10-CM | POA: Diagnosis not present

## 2023-08-18 DIAGNOSIS — F4310 Post-traumatic stress disorder, unspecified: Secondary | ICD-10-CM | POA: Diagnosis not present

## 2023-09-02 DIAGNOSIS — F4310 Post-traumatic stress disorder, unspecified: Secondary | ICD-10-CM | POA: Diagnosis not present

## 2023-09-03 DIAGNOSIS — F4310 Post-traumatic stress disorder, unspecified: Secondary | ICD-10-CM | POA: Diagnosis not present

## 2023-09-12 DIAGNOSIS — F4310 Post-traumatic stress disorder, unspecified: Secondary | ICD-10-CM | POA: Diagnosis not present

## 2023-09-23 DIAGNOSIS — F4310 Post-traumatic stress disorder, unspecified: Secondary | ICD-10-CM | POA: Diagnosis not present

## 2023-10-01 DIAGNOSIS — F4310 Post-traumatic stress disorder, unspecified: Secondary | ICD-10-CM | POA: Diagnosis not present

## 2023-10-14 DIAGNOSIS — F4310 Post-traumatic stress disorder, unspecified: Secondary | ICD-10-CM | POA: Diagnosis not present

## 2023-10-20 DIAGNOSIS — F4310 Post-traumatic stress disorder, unspecified: Secondary | ICD-10-CM | POA: Diagnosis not present

## 2023-10-24 DIAGNOSIS — A6000 Herpesviral infection of urogenital system, unspecified: Secondary | ICD-10-CM | POA: Diagnosis not present

## 2023-11-12 DIAGNOSIS — F4310 Post-traumatic stress disorder, unspecified: Secondary | ICD-10-CM | POA: Diagnosis not present

## 2023-11-24 DIAGNOSIS — F4310 Post-traumatic stress disorder, unspecified: Secondary | ICD-10-CM | POA: Diagnosis not present

## 2023-11-28 DIAGNOSIS — F4310 Post-traumatic stress disorder, unspecified: Secondary | ICD-10-CM | POA: Diagnosis not present
